# Patient Record
Sex: Female | Born: 1945 | Hispanic: No | Smoking: Never smoker
Health system: Southern US, Community
[De-identification: ages and names within clinical notes are randomized; demographics above are authoritative.]

## PROBLEM LIST (undated history)

## (undated) DIAGNOSIS — R42 Dizziness and giddiness: Secondary | ICD-10-CM

## (undated) DIAGNOSIS — I10 Essential (primary) hypertension: Secondary | ICD-10-CM

## (undated) DIAGNOSIS — Z9289 Personal history of other medical treatment: Secondary | ICD-10-CM

## (undated) DIAGNOSIS — H269 Unspecified cataract: Secondary | ICD-10-CM

## (undated) DIAGNOSIS — R091 Pleurisy: Secondary | ICD-10-CM

## (undated) DIAGNOSIS — J189 Pneumonia, unspecified organism: Secondary | ICD-10-CM

## (undated) HISTORY — PX: AUGMENTATION MAMMAPLASTY: SUR837

## (undated) HISTORY — PX: BREAST SURGERY: SHX581

---

## 1978-01-24 HISTORY — PX: LACERATION REPAIR: SHX5168

## 2003-01-25 HISTORY — PX: BREAST EXCISIONAL BIOPSY: SUR124

## 2011-03-25 ENCOUNTER — Emergency Department: Payer: Self-pay | Admitting: Emergency Medicine

## 2011-03-25 LAB — CBC
HGB: 13.2 g/dL (ref 12.0–16.0)
MCHC: 33.7 g/dL (ref 32.0–36.0)
MCV: 91 fL (ref 80–100)
RDW: 13.2 % (ref 11.5–14.5)
WBC: 9.1 10*3/uL (ref 3.6–11.0)

## 2011-03-25 LAB — COMPREHENSIVE METABOLIC PANEL
Alkaline Phosphatase: 76 U/L (ref 50–136)
Bilirubin,Total: 0.4 mg/dL (ref 0.2–1.0)
Chloride: 102 mmol/L (ref 98–107)
Co2: 30 mmol/L (ref 21–32)
Creatinine: 0.98 mg/dL (ref 0.60–1.30)
EGFR (African American): >60
EGFR (Non-African Amer.): >60
Glucose: 97 mg/dL (ref 65–99)
SGPT (ALT): 34 U/L

## 2011-03-25 LAB — TROPONIN I: Troponin-I: <0.02 ng/mL

## 2011-09-05 ENCOUNTER — Ambulatory Visit: Payer: Self-pay | Admitting: Internal Medicine

## 2012-10-09 ENCOUNTER — Ambulatory Visit: Payer: Self-pay | Admitting: Internal Medicine

## 2013-06-03 ENCOUNTER — Emergency Department: Payer: Self-pay | Admitting: Emergency Medicine

## 2013-11-04 ENCOUNTER — Ambulatory Visit: Payer: Self-pay | Admitting: Internal Medicine

## 2014-02-07 DIAGNOSIS — M159 Polyosteoarthritis, unspecified: Secondary | ICD-10-CM | POA: Diagnosis not present

## 2014-02-07 DIAGNOSIS — J3089 Other allergic rhinitis: Secondary | ICD-10-CM | POA: Diagnosis not present

## 2014-02-07 DIAGNOSIS — I10 Essential (primary) hypertension: Secondary | ICD-10-CM | POA: Diagnosis not present

## 2014-02-07 DIAGNOSIS — G44209 Tension-type headache, unspecified, not intractable: Secondary | ICD-10-CM | POA: Diagnosis not present

## 2014-08-04 DIAGNOSIS — G44209 Tension-type headache, unspecified, not intractable: Secondary | ICD-10-CM | POA: Diagnosis not present

## 2014-08-04 DIAGNOSIS — M159 Polyosteoarthritis, unspecified: Secondary | ICD-10-CM | POA: Diagnosis not present

## 2014-08-04 DIAGNOSIS — J3089 Other allergic rhinitis: Secondary | ICD-10-CM | POA: Diagnosis not present

## 2014-08-04 DIAGNOSIS — I10 Essential (primary) hypertension: Secondary | ICD-10-CM | POA: Diagnosis not present

## 2014-08-11 DIAGNOSIS — Z780 Asymptomatic menopausal state: Secondary | ICD-10-CM | POA: Diagnosis not present

## 2014-08-11 DIAGNOSIS — I10 Essential (primary) hypertension: Secondary | ICD-10-CM | POA: Diagnosis not present

## 2014-08-11 DIAGNOSIS — J0111 Acute recurrent frontal sinusitis: Secondary | ICD-10-CM | POA: Diagnosis not present

## 2014-08-11 DIAGNOSIS — Z0000 Encounter for general adult medical examination without abnormal findings: Secondary | ICD-10-CM | POA: Diagnosis not present

## 2014-09-09 DIAGNOSIS — H2513 Age-related nuclear cataract, bilateral: Secondary | ICD-10-CM | POA: Diagnosis not present

## 2014-09-22 DIAGNOSIS — J0111 Acute recurrent frontal sinusitis: Secondary | ICD-10-CM | POA: Diagnosis not present

## 2014-09-22 DIAGNOSIS — I10 Essential (primary) hypertension: Secondary | ICD-10-CM | POA: Diagnosis not present

## 2014-09-22 DIAGNOSIS — I8390 Asymptomatic varicose veins of unspecified lower extremity: Secondary | ICD-10-CM | POA: Diagnosis not present

## 2014-09-22 DIAGNOSIS — Z0000 Encounter for general adult medical examination without abnormal findings: Secondary | ICD-10-CM | POA: Diagnosis not present

## 2014-09-22 DIAGNOSIS — Z780 Asymptomatic menopausal state: Secondary | ICD-10-CM | POA: Diagnosis not present

## 2015-02-10 DIAGNOSIS — J0111 Acute recurrent frontal sinusitis: Secondary | ICD-10-CM | POA: Diagnosis not present

## 2015-02-10 DIAGNOSIS — Z0000 Encounter for general adult medical examination without abnormal findings: Secondary | ICD-10-CM | POA: Diagnosis not present

## 2015-02-10 DIAGNOSIS — I10 Essential (primary) hypertension: Secondary | ICD-10-CM | POA: Diagnosis not present

## 2015-02-10 DIAGNOSIS — Z780 Asymptomatic menopausal state: Secondary | ICD-10-CM | POA: Diagnosis not present

## 2015-02-10 DIAGNOSIS — I8390 Asymptomatic varicose veins of unspecified lower extremity: Secondary | ICD-10-CM | POA: Diagnosis not present

## 2015-02-13 DIAGNOSIS — I8390 Asymptomatic varicose veins of unspecified lower extremity: Secondary | ICD-10-CM | POA: Diagnosis not present

## 2015-02-13 DIAGNOSIS — I10 Essential (primary) hypertension: Secondary | ICD-10-CM | POA: Diagnosis not present

## 2015-02-13 DIAGNOSIS — Z139 Encounter for screening, unspecified: Secondary | ICD-10-CM | POA: Diagnosis not present

## 2015-03-16 DIAGNOSIS — M79609 Pain in unspecified limb: Secondary | ICD-10-CM | POA: Diagnosis not present

## 2015-03-16 DIAGNOSIS — I8311 Varicose veins of right lower extremity with inflammation: Secondary | ICD-10-CM | POA: Diagnosis not present

## 2015-03-16 DIAGNOSIS — I872 Venous insufficiency (chronic) (peripheral): Secondary | ICD-10-CM | POA: Diagnosis not present

## 2015-03-16 DIAGNOSIS — I8312 Varicose veins of left lower extremity with inflammation: Secondary | ICD-10-CM | POA: Diagnosis not present

## 2015-03-16 DIAGNOSIS — M1990 Unspecified osteoarthritis, unspecified site: Secondary | ICD-10-CM | POA: Diagnosis not present

## 2015-03-16 DIAGNOSIS — M7989 Other specified soft tissue disorders: Secondary | ICD-10-CM | POA: Diagnosis not present

## 2015-05-20 DIAGNOSIS — I8311 Varicose veins of right lower extremity with inflammation: Secondary | ICD-10-CM | POA: Diagnosis not present

## 2015-05-20 DIAGNOSIS — I872 Venous insufficiency (chronic) (peripheral): Secondary | ICD-10-CM | POA: Diagnosis not present

## 2015-05-20 DIAGNOSIS — M7989 Other specified soft tissue disorders: Secondary | ICD-10-CM | POA: Diagnosis not present

## 2015-05-20 DIAGNOSIS — M79609 Pain in unspecified limb: Secondary | ICD-10-CM | POA: Diagnosis not present

## 2015-05-20 DIAGNOSIS — I8312 Varicose veins of left lower extremity with inflammation: Secondary | ICD-10-CM | POA: Diagnosis not present

## 2015-05-20 DIAGNOSIS — M1990 Unspecified osteoarthritis, unspecified site: Secondary | ICD-10-CM | POA: Diagnosis not present

## 2015-07-07 DIAGNOSIS — I890 Lymphedema, not elsewhere classified: Secondary | ICD-10-CM | POA: Diagnosis not present

## 2015-07-16 DIAGNOSIS — M79609 Pain in unspecified limb: Secondary | ICD-10-CM | POA: Diagnosis not present

## 2015-07-16 DIAGNOSIS — M7989 Other specified soft tissue disorders: Secondary | ICD-10-CM | POA: Diagnosis not present

## 2015-07-16 DIAGNOSIS — I8311 Varicose veins of right lower extremity with inflammation: Secondary | ICD-10-CM | POA: Diagnosis not present

## 2015-07-16 DIAGNOSIS — M1990 Unspecified osteoarthritis, unspecified site: Secondary | ICD-10-CM | POA: Diagnosis not present

## 2015-07-16 DIAGNOSIS — I890 Lymphedema, not elsewhere classified: Secondary | ICD-10-CM | POA: Diagnosis not present

## 2015-07-16 DIAGNOSIS — I872 Venous insufficiency (chronic) (peripheral): Secondary | ICD-10-CM | POA: Diagnosis not present

## 2015-07-16 DIAGNOSIS — I8312 Varicose veins of left lower extremity with inflammation: Secondary | ICD-10-CM | POA: Diagnosis not present

## 2015-08-06 DIAGNOSIS — I890 Lymphedema, not elsewhere classified: Secondary | ICD-10-CM | POA: Diagnosis not present

## 2015-08-31 DIAGNOSIS — I10 Essential (primary) hypertension: Secondary | ICD-10-CM | POA: Diagnosis not present

## 2015-08-31 DIAGNOSIS — I8390 Asymptomatic varicose veins of unspecified lower extremity: Secondary | ICD-10-CM | POA: Diagnosis not present

## 2015-08-31 DIAGNOSIS — Z139 Encounter for screening, unspecified: Secondary | ICD-10-CM | POA: Diagnosis not present

## 2015-09-04 DIAGNOSIS — I10 Essential (primary) hypertension: Secondary | ICD-10-CM | POA: Diagnosis not present

## 2015-09-04 DIAGNOSIS — Z1239 Encounter for other screening for malignant neoplasm of breast: Secondary | ICD-10-CM | POA: Diagnosis not present

## 2015-09-04 DIAGNOSIS — G8929 Other chronic pain: Secondary | ICD-10-CM | POA: Diagnosis not present

## 2015-09-04 DIAGNOSIS — R252 Cramp and spasm: Secondary | ICD-10-CM | POA: Diagnosis not present

## 2015-09-04 DIAGNOSIS — M79605 Pain in left leg: Secondary | ICD-10-CM | POA: Diagnosis not present

## 2015-09-04 DIAGNOSIS — R790 Abnormal level of blood mineral: Secondary | ICD-10-CM | POA: Diagnosis not present

## 2015-09-06 DIAGNOSIS — I890 Lymphedema, not elsewhere classified: Secondary | ICD-10-CM | POA: Diagnosis not present

## 2015-10-07 DIAGNOSIS — I890 Lymphedema, not elsewhere classified: Secondary | ICD-10-CM | POA: Diagnosis not present

## 2015-10-19 DIAGNOSIS — R790 Abnormal level of blood mineral: Secondary | ICD-10-CM | POA: Diagnosis not present

## 2015-11-06 DIAGNOSIS — I890 Lymphedema, not elsewhere classified: Secondary | ICD-10-CM | POA: Diagnosis not present

## 2015-12-29 DIAGNOSIS — I10 Essential (primary) hypertension: Secondary | ICD-10-CM | POA: Diagnosis not present

## 2015-12-29 DIAGNOSIS — Z0000 Encounter for general adult medical examination without abnormal findings: Secondary | ICD-10-CM | POA: Diagnosis not present

## 2015-12-29 DIAGNOSIS — R002 Palpitations: Secondary | ICD-10-CM | POA: Diagnosis not present

## 2015-12-29 DIAGNOSIS — Z1231 Encounter for screening mammogram for malignant neoplasm of breast: Secondary | ICD-10-CM | POA: Diagnosis not present

## 2016-01-06 DIAGNOSIS — I890 Lymphedema, not elsewhere classified: Secondary | ICD-10-CM | POA: Diagnosis not present

## 2016-01-14 DIAGNOSIS — R002 Palpitations: Secondary | ICD-10-CM | POA: Diagnosis not present

## 2016-02-06 DIAGNOSIS — I890 Lymphedema, not elsewhere classified: Secondary | ICD-10-CM | POA: Diagnosis not present

## 2016-03-08 DIAGNOSIS — I890 Lymphedema, not elsewhere classified: Secondary | ICD-10-CM | POA: Diagnosis not present

## 2016-04-05 DIAGNOSIS — I890 Lymphedema, not elsewhere classified: Secondary | ICD-10-CM | POA: Diagnosis not present

## 2016-05-06 DIAGNOSIS — I890 Lymphedema, not elsewhere classified: Secondary | ICD-10-CM | POA: Diagnosis not present

## 2016-06-05 DIAGNOSIS — I890 Lymphedema, not elsewhere classified: Secondary | ICD-10-CM | POA: Diagnosis not present

## 2016-07-18 DIAGNOSIS — H43812 Vitreous degeneration, left eye: Secondary | ICD-10-CM | POA: Diagnosis not present

## 2016-07-18 DIAGNOSIS — H2513 Age-related nuclear cataract, bilateral: Secondary | ICD-10-CM | POA: Diagnosis not present

## 2016-07-20 DIAGNOSIS — H2513 Age-related nuclear cataract, bilateral: Secondary | ICD-10-CM | POA: Diagnosis not present

## 2016-07-22 ENCOUNTER — Other Ambulatory Visit: Payer: Self-pay | Admitting: Internal Medicine

## 2016-07-22 DIAGNOSIS — Z1231 Encounter for screening mammogram for malignant neoplasm of breast: Secondary | ICD-10-CM

## 2016-07-24 DIAGNOSIS — H269 Unspecified cataract: Secondary | ICD-10-CM

## 2016-07-24 HISTORY — DX: Unspecified cataract: H26.9

## 2016-08-01 NOTE — H&P (Signed)
See scanned note.

## 2016-08-03 ENCOUNTER — Encounter
Admission: RE | Disposition: A | Discharge status: Home / Self Care | Payer: Self-pay | Source: Ambulatory Visit | Attending: Ophthalmology

## 2016-08-03 ENCOUNTER — Ambulatory Visit
Admission: RE | Admit: 2016-08-03 | Discharge: 2016-08-03 | Disposition: A | Discharge status: Home / Self Care | Payer: Medicare HMO | Source: Ambulatory Visit | Attending: Ophthalmology | Admitting: Ophthalmology

## 2016-08-03 ENCOUNTER — Encounter: Payer: Self-pay | Admitting: *Deleted

## 2016-08-03 ENCOUNTER — Ambulatory Visit: Payer: Medicare HMO | Admitting: Certified Registered"

## 2016-08-03 DIAGNOSIS — H2511 Age-related nuclear cataract, right eye: Secondary | ICD-10-CM | POA: Insufficient documentation

## 2016-08-03 DIAGNOSIS — Z9889 Other specified postprocedural states: Secondary | ICD-10-CM | POA: Insufficient documentation

## 2016-08-03 DIAGNOSIS — I10 Essential (primary) hypertension: Secondary | ICD-10-CM | POA: Insufficient documentation

## 2016-08-03 DIAGNOSIS — Z79899 Other long term (current) drug therapy: Secondary | ICD-10-CM | POA: Insufficient documentation

## 2016-08-03 DIAGNOSIS — Z8701 Personal history of pneumonia (recurrent): Secondary | ICD-10-CM | POA: Diagnosis not present

## 2016-08-03 DIAGNOSIS — H2513 Age-related nuclear cataract, bilateral: Secondary | ICD-10-CM | POA: Diagnosis not present

## 2016-08-03 HISTORY — DX: Essential (primary) hypertension: I10

## 2016-08-03 HISTORY — DX: Pleurisy: R09.1

## 2016-08-03 HISTORY — DX: Unspecified cataract: H26.9

## 2016-08-03 HISTORY — DX: Pneumonia, unspecified organism: J18.9

## 2016-08-03 HISTORY — PX: CATARACT EXTRACTION W/PHACO: SHX586

## 2016-08-03 HISTORY — DX: Personal history of other medical treatment: Z92.89

## 2016-08-03 SURGERY — PHACOEMULSIFICATION, CATARACT, WITH IOL INSERTION
Anesthesia: Monitor Anesthesia Care | Site: Eye | Laterality: Right | Wound class: Clean

## 2016-08-03 MED ORDER — MOXIFLOXACIN HCL 0.5 % OP SOLN
1.0000 [drp] | OPHTHALMIC | Status: DC | PRN
  Administered 2016-08-03 (×3): 1 [drp] via OPHTHALMIC

## 2016-08-03 MED ORDER — MIDAZOLAM HCL 2 MG/2ML IJ SOLN
INTRAMUSCULAR | Status: DC | PRN
  Administered 2016-08-03: 1 mg via INTRAVENOUS

## 2016-08-03 MED ORDER — ALFENTANIL 500 MCG/ML IJ INJ
INJECTION | INTRAVENOUS | Status: DC | PRN
  Administered 2016-08-03: 250 ug via INTRAVENOUS
  Administered 2016-08-03: 500 ug via INTRAVENOUS

## 2016-08-03 MED ORDER — BUPIVACAINE HCL (PF) 0.75 % IJ SOLN
INTRAMUSCULAR | Status: AC
  Filled 2016-08-03: qty 10

## 2016-08-03 MED ORDER — BUPIVACAINE HCL (PF) 0.75 % IJ SOLN
INTRAMUSCULAR | Status: DC | PRN
  Administered 2016-08-03: 09:00:00 via OPHTHALMIC

## 2016-08-03 MED ORDER — TRYPAN BLUE 0.06 % OP SOLN
OPHTHALMIC | Status: AC
Start: 2016-08-03 — End: 2016-08-03
  Filled 2016-08-03: qty 0.5

## 2016-08-03 MED ORDER — CEFUROXIME OPHTHALMIC INJECTION 1 MG/0.1 ML
INJECTION | OPHTHALMIC | Status: AC
  Filled 2016-08-03: qty 0.1

## 2016-08-03 MED ORDER — PHENYLEPHRINE HCL 10 % OP SOLN
1.0000 [drp] | OPHTHALMIC | Status: AC | PRN
  Administered 2016-08-03 (×4): 1 [drp] via OPHTHALMIC

## 2016-08-03 MED ORDER — CARBACHOL 0.01 % IO SOLN
INTRAOCULAR | Status: DC | PRN
  Administered 2016-08-03: 0.5 mL via INTRAOCULAR

## 2016-08-03 MED ORDER — MOXIFLOXACIN HCL 0.5 % OP SOLN
OPHTHALMIC | Status: DC | PRN
  Administered 2016-08-03: 0.2 mL via OPHTHALMIC

## 2016-08-03 MED ORDER — POVIDONE-IODINE 5 % OP SOLN
OPHTHALMIC | Status: DC | PRN
  Administered 2016-08-03: 1 via OPHTHALMIC

## 2016-08-03 MED ORDER — LIDOCAINE HCL (PF) 4 % IJ SOLN
INTRAOCULAR | Status: DC | PRN
  Administered 2016-08-03: 4 mL via OPHTHALMIC

## 2016-08-03 MED ORDER — GLYCOPYRROLATE 0.2 MG/ML IJ SOLN
INTRAMUSCULAR | Status: AC
  Filled 2016-08-03: qty 1

## 2016-08-03 MED ORDER — HYALURONIDASE HUMAN 150 UNIT/ML IJ SOLN
INTRAMUSCULAR | Status: AC
  Filled 2016-08-03: qty 1

## 2016-08-03 MED ORDER — EPINEPHRINE PF 1 MG/ML IJ SOLN
INTRAMUSCULAR | Status: AC
  Filled 2016-08-03: qty 1

## 2016-08-03 MED ORDER — PHENYLEPHRINE HCL 10 MG/ML IJ SOLN
INTRAMUSCULAR | Status: AC
  Filled 2016-08-03: qty 1

## 2016-08-03 MED ORDER — SODIUM CHLORIDE 0.9 % IV SOLN
INTRAVENOUS | Status: DC
  Administered 2016-08-03: 25 mL/h via INTRAVENOUS

## 2016-08-03 MED ORDER — CYCLOPENTOLATE HCL 2 % OP SOLN
OPHTHALMIC | Status: AC
Start: 2016-08-03 — End: 2016-08-03
  Administered 2016-08-03: 1 [drp] via OPHTHALMIC
  Filled 2016-08-03: qty 2

## 2016-08-03 MED ORDER — NA CHONDROIT SULF-NA HYALURON 40-17 MG/ML IO SOLN
INTRAOCULAR | Status: AC
  Filled 2016-08-03: qty 1

## 2016-08-03 MED ORDER — EPINEPHRINE PF 1 MG/ML IJ SOLN
INTRAMUSCULAR | Status: DC | PRN
  Administered 2016-08-03: 09:00:00 via OPHTHALMIC

## 2016-08-03 MED ORDER — MOXIFLOXACIN HCL 0.5 % OP SOLN
OPHTHALMIC | Status: AC
  Administered 2016-08-03: 1 [drp] via OPHTHALMIC
  Filled 2016-08-03: qty 3

## 2016-08-03 MED ORDER — TETRACAINE HCL 0.5 % OP SOLN
OPHTHALMIC | Status: AC
  Filled 2016-08-03: qty 4

## 2016-08-03 MED ORDER — POVIDONE-IODINE 5 % OP SOLN
OPHTHALMIC | Status: AC
  Filled 2016-08-03: qty 30

## 2016-08-03 MED ORDER — LIDOCAINE HCL (PF) 4 % IJ SOLN
INTRAMUSCULAR | Status: AC
  Filled 2016-08-03: qty 5

## 2016-08-03 MED ORDER — CEFUROXIME OPHTHALMIC INJECTION 1 MG/0.1 ML
INJECTION | OPHTHALMIC | Status: DC | PRN
  Administered 2016-08-03: 0.1 mL via INTRACAMERAL

## 2016-08-03 MED ORDER — TETRACAINE HCL 0.5 % OP SOLN
OPHTHALMIC | Status: DC | PRN
  Administered 2016-08-03: 1 [drp] via OPHTHALMIC

## 2016-08-03 MED ORDER — MIDAZOLAM HCL 2 MG/2ML IJ SOLN
INTRAMUSCULAR | Status: AC
  Filled 2016-08-03: qty 2

## 2016-08-03 MED ORDER — NA CHONDROIT SULF-NA HYALURON 40-17 MG/ML IO SOLN
INTRAOCULAR | Status: DC | PRN
  Administered 2016-08-03: 1 mL via INTRAOCULAR

## 2016-08-03 MED ORDER — TRYPAN BLUE 0.06 % OP SOLN
OPHTHALMIC | Status: DC | PRN
  Administered 2016-08-03: 0.5 mL via INTRAOCULAR

## 2016-08-03 MED ORDER — PHENYLEPHRINE HCL 10 % OP SOLN
OPHTHALMIC | Status: AC
  Administered 2016-08-03: 1 [drp] via OPHTHALMIC
  Filled 2016-08-03: qty 5

## 2016-08-03 MED ORDER — CYCLOPENTOLATE HCL 2 % OP SOLN
1.0000 [drp] | OPHTHALMIC | Status: AC | PRN
  Administered 2016-08-03 (×4): 1 [drp] via OPHTHALMIC

## 2016-08-03 SURGICAL SUPPLY — 28 items
CANNULA ANT/CHMB 27GA (MISCELLANEOUS) ×2 IMPLANT
CORD BIP STRL DISP 12FT (MISCELLANEOUS) ×2 IMPLANT
DRAPE XRAY CASSETTE 23X24 (DRAPES) ×2 IMPLANT
ERASER HMR WETFIELD 18G (MISCELLANEOUS) ×2 IMPLANT
FILTER MILLEX .045 (MISCELLANEOUS) ×1 IMPLANT
FLTR MILLEX .045 (MISCELLANEOUS) ×2
GLOVE BIO SURGEON STRL SZ8 (GLOVE) ×2 IMPLANT
GLOVE SURG LX 6.5 MICRO (GLOVE) ×1
GLOVE SURG LX 8.0 MICRO (GLOVE) ×1
GLOVE SURG LX STRL 6.5 MICRO (GLOVE) ×1 IMPLANT
GLOVE SURG LX STRL 8.0 MICRO (GLOVE) ×1 IMPLANT
GOWN STRL REUS W/ TWL LRG LVL3 (GOWN DISPOSABLE) ×1 IMPLANT
GOWN STRL REUS W/ TWL XL LVL3 (GOWN DISPOSABLE) ×1 IMPLANT
GOWN STRL REUS W/TWL LRG LVL3 (GOWN DISPOSABLE) ×1
GOWN STRL REUS W/TWL XL LVL3 (GOWN DISPOSABLE) ×1
LABEL CATARACT MEDS ST (LABEL) ×2 IMPLANT
LENS IOL ACRYSOF IQ 21.0 (Intraocular Lens) ×2 IMPLANT
PACK CATARACT (MISCELLANEOUS) ×2 IMPLANT
PACK CATARACT DINGLEDEIN LX (MISCELLANEOUS) ×2 IMPLANT
PACK EYE AFTER SURG (MISCELLANEOUS) ×2 IMPLANT
SHLD EYE VISITEC  UNIV (MISCELLANEOUS) ×2 IMPLANT
SOL BSS BAG (MISCELLANEOUS) ×2
SOLUTION BSS BAG (MISCELLANEOUS) ×1 IMPLANT
SUT SILK 5-0 (SUTURE) ×2 IMPLANT
SYR 3ML LL SCALE MARK (SYRINGE) ×2 IMPLANT
SYR 5ML LL (SYRINGE) ×2 IMPLANT
WATER STERILE IRR 250ML POUR (IV SOLUTION) ×2 IMPLANT
WIPE NON LINTING 3.25X3.25 (MISCELLANEOUS) ×2 IMPLANT

## 2016-08-03 NOTE — Anesthesia Procedure Notes (Signed)
Performed by: Shanta Hartner Pre-anesthesia Checklist: Patient identified, Emergency Drugs available, Suction available, Patient being monitored and Timeout performed Oxygen Delivery Method: Nasal cannula       

## 2016-08-03 NOTE — Anesthesia Preprocedure Evaluation (Signed)
Anesthesia Evaluation  Patient identified by MRN, date of birth, ID band Patient awake    Reviewed: Allergy & Precautions, H&P , NPO status , Patient's Chart, lab work & pertinent test results, reviewed documented beta blocker date and time   History of Anesthesia Complications Negative for: history of anesthetic complications  Airway Mallampati: I  TM Distance: >3 FB Neck ROM: full    Dental  (+) Poor Dentition, Dental Advidsory Given   Pulmonary neg pulmonary ROS,           Cardiovascular Exercise Tolerance: Good hypertension, (-) angina(-) CAD, (-) Past MI, (-) Cardiac Stents and (-) CABG (-) dysrhythmias (-) Valvular Problems/Murmurs     Neuro/Psych negative neurological ROS  negative psych ROS   GI/Hepatic negative GI ROS, Neg liver ROS,   Endo/Other  negative endocrine ROS  Renal/GU negative Renal ROS  negative genitourinary   Musculoskeletal   Abdominal   Peds  Hematology negative hematology ROS (+)   Anesthesia Other Findings Past Medical History: 07/2016: Cataract     Comment: right No date: Hypertension No date: Pleurisy No date: Pneumonia     Comment: history No date: Transfusion history   Reproductive/Obstetrics negative OB ROS                             Anesthesia Physical Anesthesia Plan  ASA: II  Anesthesia Plan: MAC   Post-op Pain Management:    Induction:   PONV Risk Score and Plan: 2 and Treatment may vary due to age or medical condition  Airway Management Planned: Nasal Cannula  Additional Equipment:   Intra-op Plan:   Post-operative Plan:   Informed Consent: I have reviewed the patients History and Physical, chart, labs and discussed the procedure including the risks, benefits and alternatives for the proposed anesthesia with the patient or authorized representative who has indicated his/her understanding and acceptance.   Dental Advisory  Given  Plan Discussed with: Anesthesiologist, CRNA and Surgeon  Anesthesia Plan Comments:         Anesthesia Quick Evaluation

## 2016-08-03 NOTE — Op Note (Signed)
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 Item Serial No. Manufacturer Lot No. LRB No. Used  LENS IOL ACRYSOF IQ 21.0 - [id number] Intraocular Lens LENS IOL ACRYSOF IQ 21.0 [id number] ALCON   Right 1   Anesthesia: 2% Lidocaine and 4% Marcaine in a 50/50 mixture with 10 unites/ml of Hylenex given as a peribulbar Anesthesiologist: Anesthesiologist: Lenard SimmerKarenz, Andrew, MD CRNA: Casey BurkittHoang, Thuy, CRNA Complications: none Estimated Blood Loss: less than 1 ml  Description of procedure:  The patient was given anesthesia and sedation via intravenous access. The patient was then prepped and draped in the usual fashion. A 25-gauge needle was bent for initiating the capsulorhexis. A 5-0 silk suture was placed through the conjunctiva superior and inferiorly to serve as bridle sutures. Hemostasis was obtained at the superior limbus using an eraser cautery. A partial thickness groove was made at the anterior surgical limbus with a 64 Beaver blade and this was dissected anteriorly with an SYSCOlcon Crescent knife. The anterior chamber was entered at 10 o'clock and 2 o'clock with a 1.0 mm paracentesis knife. Epi-Shugarcaine 0.5 CC [9 cc BSS Plus (Alcon), 3 cc 4% preservative-free lidocaine (Hospira) and 4 cc 1:1000 preservative-free, bisulfite-free epinephrine] was injected into the anterior chamber via the paracentesis tract. Air was introduced via the paracentesis to replace the aqueous and Vision Blue was used to paint the surface of the anterior capsule. The air was then replaced with DiscoVisc.   The anterior chamber was entered through the lamellar dissection with a 2.6 mm Alcon keratome.A continuous tear curvilinear  capsulorhexis was performed using a bent 25-gauge needle.  Balance salt on a syringe was used to perform hydro-dissection and phacoemulsification was carried out using a divide and conquer technique. Procedure(s) with comments: CATARACT EXTRACTION PHACO AND INTRAOCULAR LENS PLACEMENT (IOC) (Right) - [place] 01:16.9 AP% 21.6 CDE 27.70 Fluid Pack Lot # [id number]. Irrigation/aspiration was used to remove the residual cortex and the capsular bag was inflated with DiscoVisc. Irrigation/aspiration was used to remove the residual cortex and the capsular bag was inflated with DiscoVisc. The intraocular lens was inserted into the capsular bag using a pre-loaded UltraSert Delivery System. Irrigation/aspiration was used to remove the residual DiscoVisc. The wound was inflated with balanced salt and checked for leaks. None were found. Miostat was injected via the paracentesis track and 0.1 ml of cefuroxime containing 1 mg of drug  was injected via the paracentesis track. The wound was checked for leaks again and none were found.   The bridal sutures were removed and two drops of Vigamox were placed on the eye. An eye shield was placed to protect the eye and the patient was discharged to the recovery area in good condition.   Chue Berkovich MD

## 2016-08-03 NOTE — Discharge Instructions (Signed)
Eye Surgery Discharge Instructions  Expect mild scratchy sensation or mild soreness. DO NOT RUB YOUR EYE!  The day of surgery:  Minimal physical activity, but bed rest is not required  No reading, computer work, or close hand work  No bending, lifting, or straining.  May watch TV  For 24 hours:  No driving, legal decisions, or alcoholic beverages  Safety precautions  Eat anything you prefer: It is better to start with liquids, then soup then solid foods.  _____ Eye patch should be worn until postoperative exam tomorrow.  ____ Solar shield eyeglasses should be worn for comfort in the sunlight/patch while sleeping  Resume all regular medications including aspirin or Coumadin if these were discontinued prior to surgery. You may shower, bathe, shave, or wash your hair. Tylenol may be taken for mild discomfort.  Call your doctor if you experience significant pain, nausea, or vomiting, fever > 101 or other signs of infection. [phone] or [phone] Specific instructions:  Follow-up Information    Hanad Leino, Viviann SpareSteven, MD Follow up.   Specialty:  Ophthalmology Why:  July 12 at 10:30am Contact information: [address]   [place] [address] [phone]

## 2016-08-03 NOTE — Anesthesia Post-op Follow-up Note (Cosign Needed)
Anesthesia QCDR form completed.        

## 2016-08-03 NOTE — Transfer of Care (Signed)
Immediate Anesthesia Transfer of Care Note  Patient: JAMISEN HAWES  Procedure(s) Performed: Procedure(s) with comments: CATARACT EXTRACTION PHACO AND INTRAOCULAR LENS PLACEMENT (IOC) (Right) - [place] 01:16.9 AP% 21.6 CDE 27.70 Fluid Pack Lot # [id number]  Patient Location: PACU  Anesthesia Type:MAC  Level of Consciousness: awake, alert  and oriented  Airway & Oxygen Therapy: Patient Spontanous Breathing  Post-op Assessment: Report given to RN and Post -op Vital signs reviewed and stable  Post vital signs: Reviewed and stable  Last Vitals:  Vitals:   08/03/16 0706 08/03/16 0920  BP: (!) 141/79 139/73  Pulse: 61 65  Resp: 15 14  Temp: 36.8 C     Last Pain:  Vitals:   08/03/16 0706  TempSrc: Oral      Patients Stated Pain Goal: 0 (75/91/63 8466)  Complications: No apparent anesthesia complications

## 2016-08-03 NOTE — Interval H&P Note (Signed)
History and Physical Interval Note:  08/03/2016 7:27 AM  Leslie Vincent  has presented today for surgery, with the diagnosis of NUCLEAR SCLEROTIC CATARACT RIGHT EYE  The various methods of treatment have been discussed with the patient and family. After consideration of risks, benefits and other options for treatment, the patient has consented to  Procedure(s) with comments: CATARACT EXTRACTION PHACO AND INTRAOCULAR LENS PLACEMENT (IOC) (Right) - US AP% CDE Fluid Pack Lot # [id number] as a surgical intervention .  The patient's history has been reviewed, patient examined, no change in status, stable for surgery.  I have reviewed the patient's chart and labs.  Questions were answered to the patient's satisfaction.     Leslie Vincent

## 2016-08-03 NOTE — Anesthesia Postprocedure Evaluation (Signed)
Anesthesia Post Note  Patient: Leslie Vincent  Procedure(s) Performed: Procedure(s) (LRB): CATARACT EXTRACTION PHACO AND INTRAOCULAR LENS PLACEMENT (IOC) (Right)  Patient location during evaluation: PACU Anesthesia Type: MAC Level of consciousness: awake, awake and alert and oriented Pain management: pain level controlled Vital Signs Assessment: post-procedure vital signs reviewed and stable Respiratory status: spontaneous breathing, nonlabored ventilation and respiratory function stable Cardiovascular status: stable     Last Vitals:  Vitals:   08/03/16 0920 08/03/16 0922  BP: 139/73 139/73  Pulse: 65 68  Resp: 14 16  Temp:  (!) 35.9 C    Last Pain:  Vitals:   08/03/16 0706  TempSrc: Oral                 [employer]

## 2016-08-15 DIAGNOSIS — I10 Essential (primary) hypertension: Secondary | ICD-10-CM | POA: Diagnosis not present

## 2016-08-15 DIAGNOSIS — Z0000 Encounter for general adult medical examination without abnormal findings: Secondary | ICD-10-CM | POA: Diagnosis not present

## 2016-08-15 DIAGNOSIS — L03114 Cellulitis of left upper limb: Secondary | ICD-10-CM | POA: Diagnosis not present

## 2016-08-29 ENCOUNTER — Ambulatory Visit
Admission: RE | Admit: 2016-08-29 | Discharge: 2016-08-29 | Disposition: A | Discharge status: Home / Self Care | Payer: Medicare HMO | Source: Ambulatory Visit | Attending: Internal Medicine | Admitting: Internal Medicine

## 2016-08-29 DIAGNOSIS — Z1231 Encounter for screening mammogram for malignant neoplasm of breast: Secondary | ICD-10-CM | POA: Diagnosis not present

## 2016-09-09 DIAGNOSIS — Z961 Presence of intraocular lens: Secondary | ICD-10-CM | POA: Diagnosis not present

## 2017-03-09 DIAGNOSIS — L03114 Cellulitis of left upper limb: Secondary | ICD-10-CM | POA: Diagnosis not present

## 2017-03-09 DIAGNOSIS — I10 Essential (primary) hypertension: Secondary | ICD-10-CM | POA: Diagnosis not present

## 2017-03-17 DIAGNOSIS — N8110 Cystocele, unspecified: Secondary | ICD-10-CM | POA: Diagnosis not present

## 2017-03-17 DIAGNOSIS — Z0000 Encounter for general adult medical examination without abnormal findings: Secondary | ICD-10-CM | POA: Diagnosis not present

## 2017-03-17 DIAGNOSIS — I10 Essential (primary) hypertension: Secondary | ICD-10-CM | POA: Diagnosis not present

## 2017-03-17 DIAGNOSIS — J302 Other seasonal allergic rhinitis: Secondary | ICD-10-CM | POA: Diagnosis not present

## 2017-04-25 DIAGNOSIS — H2512 Age-related nuclear cataract, left eye: Secondary | ICD-10-CM | POA: Diagnosis not present

## 2018-02-22 ENCOUNTER — Other Ambulatory Visit: Payer: Self-pay | Admitting: Internal Medicine

## 2018-02-22 DIAGNOSIS — Z1231 Encounter for screening mammogram for malignant neoplasm of breast: Secondary | ICD-10-CM

## 2019-03-22 ENCOUNTER — Other Ambulatory Visit: Payer: Self-pay | Admitting: Internal Medicine

## 2019-03-22 DIAGNOSIS — Z1231 Encounter for screening mammogram for malignant neoplasm of breast: Secondary | ICD-10-CM

## 2019-05-22 ENCOUNTER — Other Ambulatory Visit: Payer: Self-pay | Admitting: Internal Medicine

## 2019-05-22 DIAGNOSIS — Z1231 Encounter for screening mammogram for malignant neoplasm of breast: Secondary | ICD-10-CM

## 2019-08-06 ENCOUNTER — Ambulatory Visit
Admission: RE | Admit: 2019-08-06 | Discharge: 2019-08-06 | Disposition: A | Discharge status: Home / Self Care | Payer: Medicare Other | Source: Ambulatory Visit | Attending: Internal Medicine | Admitting: Internal Medicine

## 2019-08-06 DIAGNOSIS — Z1231 Encounter for screening mammogram for malignant neoplasm of breast: Secondary | ICD-10-CM | POA: Insufficient documentation

## 2019-08-08 ENCOUNTER — Other Ambulatory Visit: Payer: Self-pay | Admitting: Internal Medicine

## 2019-08-08 DIAGNOSIS — N6320 Unspecified lump in the left breast, unspecified quadrant: Secondary | ICD-10-CM

## 2019-08-08 DIAGNOSIS — N644 Mastodynia: Secondary | ICD-10-CM

## 2019-08-14 ENCOUNTER — Ambulatory Visit
Admission: RE | Admit: 2019-08-14 | Discharge: 2019-08-14 | Disposition: A | Discharge status: Home / Self Care | Payer: Medicare Other | Source: Ambulatory Visit | Attending: Internal Medicine | Admitting: Internal Medicine

## 2019-08-14 DIAGNOSIS — N644 Mastodynia: Secondary | ICD-10-CM | POA: Insufficient documentation

## 2019-08-14 DIAGNOSIS — N6320 Unspecified lump in the left breast, unspecified quadrant: Secondary | ICD-10-CM | POA: Insufficient documentation

## 2019-12-16 ENCOUNTER — Encounter: Payer: Self-pay | Admitting: Ophthalmology

## 2019-12-16 ENCOUNTER — Other Ambulatory Visit: Payer: Self-pay

## 2019-12-18 NOTE — Discharge Instructions (Signed)

## 2019-12-20 ENCOUNTER — Other Ambulatory Visit: Payer: Self-pay

## 2019-12-20 ENCOUNTER — Other Ambulatory Visit
Admission: RE | Admit: 2019-12-20 | Discharge: 2019-12-20 | Disposition: A | Discharge status: Home / Self Care | Payer: Medicare Other | Source: Ambulatory Visit | Attending: Ophthalmology | Admitting: Ophthalmology

## 2019-12-20 DIAGNOSIS — Z01812 Encounter for preprocedural laboratory examination: Secondary | ICD-10-CM | POA: Insufficient documentation

## 2019-12-20 DIAGNOSIS — Z20822 Contact with and (suspected) exposure to covid-19: Secondary | ICD-10-CM | POA: Insufficient documentation

## 2019-12-21 LAB — SARS CORONAVIRUS 2 (TAT 6-24 HRS): SARS Coronavirus 2: NEGATIVE

## 2019-12-23 MED ORDER — SODIUM CHLORIDE 0.9 % IV SOLN: INTRAVENOUS | Status: DC

## 2019-12-24 ENCOUNTER — Encounter: Payer: Self-pay | Admitting: Ophthalmology

## 2019-12-24 ENCOUNTER — Encounter: Payer: Self-pay | Admitting: Anesthesiology

## 2019-12-24 ENCOUNTER — Ambulatory Visit: Payer: Self-pay | Admitting: Anesthesiology

## 2019-12-24 ENCOUNTER — Ambulatory Visit: Admission: RE | Admit: 2019-12-24 | Payer: Medicare Other | Source: Home / Self Care | Admitting: Ophthalmology

## 2019-12-24 ENCOUNTER — Ambulatory Visit
Admission: RE | Admit: 2019-12-24 | Discharge: 2019-12-24 | Disposition: A | Discharge status: Home / Self Care | Payer: Medicare Other | Attending: Ophthalmology | Admitting: Ophthalmology

## 2019-12-24 ENCOUNTER — Other Ambulatory Visit: Payer: Self-pay

## 2019-12-24 ENCOUNTER — Encounter
Admission: RE | Disposition: A | Discharge status: Home / Self Care | Payer: Self-pay | Source: Home / Self Care | Attending: Ophthalmology

## 2019-12-24 DIAGNOSIS — Z79899 Other long term (current) drug therapy: Secondary | ICD-10-CM | POA: Insufficient documentation

## 2019-12-24 DIAGNOSIS — H2512 Age-related nuclear cataract, left eye: Secondary | ICD-10-CM | POA: Diagnosis not present

## 2019-12-24 HISTORY — DX: Dizziness and giddiness: R42

## 2019-12-24 HISTORY — PX: CATARACT EXTRACTION W/PHACO: SHX586

## 2019-12-24 SURGERY — PHACOEMULSIFICATION, CATARACT, WITH IOL INSERTION
Anesthesia: Monitor Anesthesia Care | Site: Eye | Laterality: Left

## 2019-12-24 MED ORDER — MIDAZOLAM HCL 2 MG/2ML IJ SOLN
INTRAMUSCULAR | Status: DC | PRN
  Administered 2019-12-24: 1 mg via INTRAVENOUS

## 2019-12-24 MED ORDER — FENTANYL CITRATE (PF) 100 MCG/2ML IJ SOLN
INTRAMUSCULAR | Status: DC | PRN
  Administered 2019-12-24: 25 ug via INTRAVENOUS

## 2019-12-24 MED ORDER — NA CHONDROIT SULF-NA HYALURON 40-17 MG/ML IO SOLN
INTRAOCULAR | Status: DC | PRN
  Administered 2019-12-24: 1 mL via INTRAOCULAR

## 2019-12-24 MED ORDER — ARMC OPHTHALMIC DILATING DROPS
1.0000 "application " | OPHTHALMIC | Status: DC | PRN
  Administered 2019-12-24 (×3): 1 via OPHTHALMIC

## 2019-12-24 MED ORDER — TETRACAINE HCL 0.5 % OP SOLN
1.0000 [drp] | OPHTHALMIC | Status: DC | PRN
  Administered 2019-12-24 (×3): 1 [drp] via OPHTHALMIC

## 2019-12-24 MED ORDER — BRIMONIDINE TARTRATE-TIMOLOL 0.2-0.5 % OP SOLN
OPHTHALMIC | Status: DC | PRN
  Administered 2019-12-24: 1 [drp] via OPHTHALMIC

## 2019-12-24 MED ORDER — MIDAZOLAM HCL 2 MG/2ML IJ SOLN
INTRAMUSCULAR | Status: AC
  Filled 2019-12-24: qty 2

## 2019-12-24 MED ORDER — EPINEPHRINE PF 1 MG/ML IJ SOLN
INTRAOCULAR | Status: DC | PRN
  Administered 2019-12-24: 103 mL via OPHTHALMIC

## 2019-12-24 MED ORDER — LIDOCAINE HCL (PF) 2 % IJ SOLN
INTRAOCULAR | Status: DC | PRN
  Administered 2019-12-24: 2 mL

## 2019-12-24 MED ORDER — FENTANYL CITRATE (PF) 100 MCG/2ML IJ SOLN
INTRAMUSCULAR | Status: AC
  Filled 2019-12-24: qty 2

## 2019-12-24 MED ORDER — MOXIFLOXACIN HCL 0.5 % OP SOLN
OPHTHALMIC | Status: DC | PRN
  Administered 2019-12-24: 0.2 mL via OPHTHALMIC

## 2019-12-24 SURGICAL SUPPLY — 17 items
CANNULA ANT/CHMB 27GA (MISCELLANEOUS) ×6 IMPLANT
GLOVE BIO SURGEON STRL SZ8 (GLOVE) ×3 IMPLANT
GLOVE BIOGEL M 6.5 STRL (GLOVE) ×3 IMPLANT
GLOVE SURG LX 8.0 MICRO (GLOVE) ×2
GLOVE SURG LX STRL 8.0 MICRO (GLOVE) ×1 IMPLANT
GOWN STRL REUS W/ TWL LRG LVL3 (GOWN DISPOSABLE) ×2 IMPLANT
GOWN STRL REUS W/TWL LRG LVL3 (GOWN DISPOSABLE) ×4
LENS IOL ACRYSOF IQ 21.5 (Intraocular Lens) ×3 IMPLANT
NEEDLE FILTER BLUNT 18X 1/2SAF (NEEDLE) ×2
NEEDLE FILTER BLUNT 18X1 1/2 (NEEDLE) ×1 IMPLANT
PACK CATARACT (MISCELLANEOUS) ×3 IMPLANT
PACK CATARACT BRASINGTON LX (MISCELLANEOUS) ×3 IMPLANT
PACK EYE AFTER SURG (MISCELLANEOUS) ×3 IMPLANT
SYR 3ML LL SCALE MARK (SYRINGE) ×6 IMPLANT
SYR TB 1ML LUER SLIP (SYRINGE) ×3 IMPLANT
WATER STERILE IRR 250ML POUR (IV SOLUTION) ×3 IMPLANT
WIPE NON LINTING 3.25X3.25 (MISCELLANEOUS) ×3 IMPLANT

## 2019-12-24 NOTE — Anesthesia Preprocedure Evaluation (Signed)
Anesthesia Evaluation  Patient identified by MRN, date of birth, ID band Patient awake    Reviewed: Allergy & Precautions, NPO status , Patient's Chart, lab work & pertinent test results  Airway Mallampati: II       Dental no notable dental hx.    Pulmonary neg pulmonary ROS,    Pulmonary exam normal breath sounds clear to auscultation       Cardiovascular hypertension, negative cardio ROS Normal cardiovascular exam Rhythm:Regular Rate:Normal     Neuro/Psych negative neurological ROS  negative psych ROS   GI/Hepatic negative GI ROS, Neg liver ROS,   Endo/Other  negative endocrine ROS  Renal/GU negative Renal ROS  negative genitourinary   Musculoskeletal negative musculoskeletal ROS (+)   Abdominal   Peds negative pediatric ROS (+)  Hematology negative hematology ROS (+)   Anesthesia Other Findings .Marland KitchenPast Medical History: 07/2016: Cataract     Comment:  right No date: Hypertension No date: Pleurisy No date: Pneumonia     Comment:  history No date: Transfusion history No date: Vertigo     Comment:  1980s.   Reproductive/Obstetrics negative OB ROS                             Anesthesia Physical Anesthesia Plan  ASA: II  Anesthesia Plan: MAC   Post-op Pain Management:    Induction: Intravenous  PONV Risk Score and Plan: 2  Airway Management Planned: Nasal Cannula  Additional Equipment:   Intra-op Plan:   Post-operative Plan: Extubation in OR  Informed Consent: I have reviewed the patients History and Physical, chart, labs and discussed the procedure including the risks, benefits and alternatives for the proposed anesthesia with the patient or authorized representative who has indicated his/her understanding and acceptance.       Plan Discussed with: CRNA, Anesthesiologist and Surgeon  Anesthesia Plan Comments:         Anesthesia Quick Evaluation

## 2019-12-24 NOTE — Anesthesia Postprocedure Evaluation (Signed)
Anesthesia Post Note  Patient: ANIKO FINNIGAN  Procedure(s) Performed: CATARACT EXTRACTION PHACO AND INTRAOCULAR LENS PLACEMENT (IOC) LEFT 4.07 00:34.4 (Left Eye)  Patient location during evaluation: Short Stay Anesthesia Type: MAC Level of consciousness: awake and alert Pain management: pain level controlled Vital Signs Assessment: post-procedure vital signs reviewed and stable Respiratory status: spontaneous breathing, nonlabored ventilation, respiratory function stable and patient connected to nasal cannula oxygen Cardiovascular status: stable and blood pressure returned to baseline Postop Assessment: no apparent nausea or vomiting Anesthetic complications: no   No complications documented.   Last Vitals:  Vitals:   12/24/19 0940 12/24/19 1120  BP: (!) 144/82 130/79  Pulse: 74 64  Resp: 16 16  Temp: (!) 36.2 C 36.7 C  SpO2: 98% 99%    Last Pain:  Vitals:   12/24/19 1120  TempSrc:   PainSc: 0-No pain                 Rolla Plate P

## 2019-12-24 NOTE — Transfer of Care (Signed)
Immediate Anesthesia Transfer of Care Note  Patient: Leslie Vincent  Procedure(s) Performed: CATARACT EXTRACTION PHACO AND INTRAOCULAR LENS PLACEMENT (IOC) LEFT 4.07 00:34.4 (Left Eye)  Patient Location: Short Stay  Anesthesia Type:MAC  Level of Consciousness: awake, alert  and oriented  Airway & Oxygen Therapy: Patient Spontanous Breathing  Post-op Assessment: Report given to RN and Post -op Vital signs reviewed and stable  Post vital signs: Reviewed  Last Vitals:  Vitals Value Taken Time  BP 130/79 12/24/19 1120  Temp 36.7 C 12/24/19 1120  Pulse 64 12/24/19 1120  Resp 16 12/24/19 1120  SpO2 99 % 12/24/19 1120    Last Pain:  Vitals:   12/24/19 1120  TempSrc:   PainSc: 0-No pain         Complications: No complications documented.

## 2019-12-24 NOTE — H&P (Signed)
[facility]   Primary Care Physician:  Barbette Reichmann, MD Ophthalmologist: Dr. Druscilla Brownie  Pre-Procedure History & Physical: HPI:  Leslie Vincent is a 74 y.o. female here for cataract surgery.   Past Medical History:  Diagnosis Date  . Cataract 07/2016   right  . Hypertension   . Pleurisy   . Pneumonia    history  . Transfusion history   . Vertigo    1980s.    Past Surgical History:  Procedure Laterality Date  . AUGMENTATION MAMMAPLASTY Bilateral 1970's   silicone injections  . BREAST EXCISIONAL BIOPSY Bilateral 2005   Benign  . BREAST SURGERY Bilateral    biopsy. each benign  . CATARACT EXTRACTION W/PHACO Right 08/03/2016   Procedure: CATARACT EXTRACTION PHACO AND INTRAOCULAR LENS PLACEMENT (IOC);  Surgeon: Sallee Lange, MD;  Location: [facility] ORS;  Service: Ophthalmology;  Laterality: Right;  [place] 01:16.9 AP% 21.6 CDE 27.70 Fluid Pack Lot # [id number]  . LACERATION REPAIR  1980   throat    Prior to Admission medications   Medication Sig Start Date End Date Taking? Authorizing Provider  amLODipine (NORVASC) 5 MG tablet Take 7.5 mg by mouth daily in the afternoon.    Yes [provider]  ibuprofen (ADVIL) 200 MG tablet Take 400 mg by mouth every 6 (six) hours as needed for mild pain or moderate pain.   Yes [provider]  losartan-hydrochlorothiazide (HYZAAR) 100-25 MG tablet Take 1 tablet by mouth daily. 11/01/19  Yes [provider]  Magnesium 400 MG TABS Take 400 mg by mouth daily.    Yes [provider]  Multiple Vitamin (MULTIVITAMIN) tablet Take 1 tablet by mouth daily. Centrum  Gummie   Yes [provider]  Specialty Vitamins Products (BIOTIN PLUS KERATIN PO) Take 1 tablet by mouth daily.    Yes [provider]    Allergies as of 10/31/2019 - Review Complete 08/03/2016  Allergen Reaction Noted  . Bee venom Anaphylaxis 07/25/2016    Family History  Problem Relation Age of Onset  . Breast cancer  Neg Hx     Social History   Socioeconomic History  . Marital status: Divorced    Spouse name: Not on file  . Number of children: Not on file  . Years of education: Not on file  . Highest education level: Not on file  Occupational History  . Not on file  Tobacco Use  . Smoking status: Never Smoker  . Smokeless tobacco: Never Used  Vaping Use  . Vaping Use: Never used  Substance and Sexual Activity  . Alcohol use: Yes    Comment: glass of wine couple times a week  . Drug use: Not on file  . Sexual activity: Never  Other Topics Concern  . Not on file  Social History Narrative  . Not on file   Social Determinants of Health   Financial Resource Strain:   . Difficulty of Paying Living Expenses: Not on file  Food Insecurity:   . Worried About Programme researcher, broadcasting/film/video in the Last Year: Not on file  . Ran Out of Food in the Last Year: Not on file  Transportation Needs:   . Lack of Transportation (Medical): Not on file  . Lack of Transportation (Non-Medical): Not on file  Physical Activity:   . Days of Exercise per Week: Not on file  . Minutes of Exercise per Session: Not on file  Stress:   . Feeling of Stress : Not on file  Social  Connections:   . Frequency of Communication with Friends and Family: Not on file  . Frequency of Social Gatherings with Friends and Family: Not on file  . Attends Religious Services: Not on file  . Active Member of Clubs or Organizations: Not on file  . Attends Banker Meetings: Not on file  . Marital Status: Not on file  Intimate Partner Violence:   . Fear of Current or Ex-Partner: Not on file  . Emotionally Abused: Not on file  . Physically Abused: Not on file  . Sexually Abused: Not on file    Review of Systems: See HPI, otherwise negative ROS  Physical Exam: BP (!) 144/82   Pulse 74   Temp (!) 97.2 F (36.2 C) (Temporal)   Resp 16   Ht 4\' 11"  (1.499 m)   Wt 70.3 kg   SpO2 98%   BMI 31.31 kg/m  General:   Alert,   pleasant and cooperative in NAD Head:  Normocephalic and atraumatic. Respiratory:  Normal work of breathing. Heart:  Regular rate and rhythm.   Impression/Plan: is here for cataract surgery.  Risks, benefits, limitations, and alternatives regarding cataract surgery have been reviewed with the patient.  Questions have been answered.  All parties agreeable.   Madolyn Frieze, MD  12/24/2019, 10:43 AM

## 2019-12-24 NOTE — Op Note (Signed)
PREOPERATIVE DIAGNOSIS:  Nuclear sclerotic cataract of the left eye.   POSTOPERATIVE DIAGNOSIS:  Nuclear sclerotic cataract of the left eye.   OPERATIVE PROCEDURE:@   SURGEON:  Galen Manila, MD.   ANESTHESIA:  Anesthesiologist: Emilio Math, DO CRNA: Mathews Argyle, CRNA Student Nurse Anesthetist: Edison Pace, RN  1.      Managed anesthesia care. 2.     0.4ml of Shugarcaine was instilled following the paracentesis   COMPLICATIONS:  None.   TECHNIQUE:   Stop and chop   DESCRIPTION OF PROCEDURE:  The patient was examined and consented in the preoperative holding area where the aforementioned topical anesthesia was applied to the left eye and then brought back to the Operating Room where the left eye was prepped and draped in the usual sterile ophthalmic fashion and a lid speculum was placed. A paracentesis was created with the side port blade and the anterior chamber was filled with viscoelastic. A near clear corneal incision was performed with the steel keratome. A continuous curvilinear capsulorrhexis was performed with a cystotome followed by the capsulorrhexis forceps. Hydrodissection and hydrodelineation were carried out with BSS on a blunt cannula. The lens was removed in a stop and chop  technique and the remaining cortical material was removed with the irrigation-aspiration handpiece. The capsular bag was inflated with viscoelastic and the [id number] lens was placed in the capsular bag without complication. The remaining viscoelastic was removed from the eye with the irrigation-aspiration handpiece. The wounds were hydrated. The anterior chamber was flushed with BSS and the eye was inflated to physiologic pressure. 0.74ml Vigamox was placed in the anterior chamber. The wounds were found to be water tight. The eye was dressed with Combigan. The patient was given protective glasses to wear throughout the day and a shield with which to sleep tonight. The patient was also given drops  with which to begin a drop regimen today and will follow-up with me in one day. Implant Name Type Inv. Item Serial No. Manufacturer Lot No. LRB No. Used Action  Acrysof IQ Aspheric IOL Intraocular Lens  [id number]   Left 1 Implanted    Procedure(s): CATARACT EXTRACTION PHACO AND INTRAOCULAR LENS PLACEMENT (IOC) LEFT 4.07 00:34.4 (Left)  Electronically signed: Galen Manila 12/24/2019 11:18 AM

## 2020-03-18 DIAGNOSIS — Z0000 Encounter for general adult medical examination without abnormal findings: Secondary | ICD-10-CM | POA: Diagnosis not present

## 2020-03-18 DIAGNOSIS — I10 Essential (primary) hypertension: Secondary | ICD-10-CM | POA: Diagnosis not present

## 2020-03-18 DIAGNOSIS — J302 Other seasonal allergic rhinitis: Secondary | ICD-10-CM | POA: Diagnosis not present

## 2020-03-18 DIAGNOSIS — M170 Bilateral primary osteoarthritis of knee: Secondary | ICD-10-CM | POA: Diagnosis not present

## 2020-03-18 DIAGNOSIS — Z91030 Bee allergy status: Secondary | ICD-10-CM | POA: Diagnosis not present

## 2020-03-18 DIAGNOSIS — R945 Abnormal results of liver function studies: Secondary | ICD-10-CM | POA: Diagnosis not present

## 2020-04-14 DIAGNOSIS — T7840XA Allergy, unspecified, initial encounter: Secondary | ICD-10-CM | POA: Diagnosis not present

## 2020-04-14 DIAGNOSIS — I10 Essential (primary) hypertension: Secondary | ICD-10-CM | POA: Diagnosis not present

## 2020-04-14 DIAGNOSIS — G4700 Insomnia, unspecified: Secondary | ICD-10-CM | POA: Diagnosis not present

## 2020-04-14 DIAGNOSIS — Z13820 Encounter for screening for osteoporosis: Secondary | ICD-10-CM | POA: Diagnosis not present

## 2020-04-14 DIAGNOSIS — Z780 Asymptomatic menopausal state: Secondary | ICD-10-CM | POA: Diagnosis not present

## 2020-04-14 DIAGNOSIS — R945 Abnormal results of liver function studies: Secondary | ICD-10-CM | POA: Diagnosis not present

## 2020-04-14 DIAGNOSIS — M170 Bilateral primary osteoarthritis of knee: Secondary | ICD-10-CM | POA: Diagnosis not present

## 2020-04-27 DIAGNOSIS — H26492 Other secondary cataract, left eye: Secondary | ICD-10-CM | POA: Diagnosis not present

## 2020-05-12 DIAGNOSIS — M8588 Other specified disorders of bone density and structure, other site: Secondary | ICD-10-CM | POA: Diagnosis not present

## 2020-10-22 ENCOUNTER — Other Ambulatory Visit: Payer: Self-pay | Admitting: Internal Medicine

## 2020-10-22 DIAGNOSIS — Z1231 Encounter for screening mammogram for malignant neoplasm of breast: Secondary | ICD-10-CM

## 2020-11-20 ENCOUNTER — Ambulatory Visit
Admission: RE | Admit: 2020-11-20 | Discharge: 2020-11-20 | Disposition: A | Discharge status: Home / Self Care | Payer: Medicare Other | Source: Ambulatory Visit | Attending: Internal Medicine | Admitting: Internal Medicine

## 2020-11-20 ENCOUNTER — Other Ambulatory Visit: Payer: Self-pay

## 2020-11-20 DIAGNOSIS — Z1231 Encounter for screening mammogram for malignant neoplasm of breast: Secondary | ICD-10-CM | POA: Diagnosis present

## 2021-01-29 ENCOUNTER — Other Ambulatory Visit: Payer: Self-pay | Admitting: Gastroenterology

## 2021-01-29 DIAGNOSIS — R1319 Other dysphagia: Secondary | ICD-10-CM

## 2021-01-29 DIAGNOSIS — K219 Gastro-esophageal reflux disease without esophagitis: Secondary | ICD-10-CM

## 2021-01-29 DIAGNOSIS — R7989 Other specified abnormal findings of blood chemistry: Secondary | ICD-10-CM

## 2021-01-29 DIAGNOSIS — B182 Chronic viral hepatitis C: Secondary | ICD-10-CM

## 2021-02-12 ENCOUNTER — Ambulatory Visit
Admission: RE | Admit: 2021-02-12 | Discharge: 2021-02-12 | Disposition: A | Discharge status: Home / Self Care | Payer: Medicare Other | Source: Ambulatory Visit | Attending: Gastroenterology | Admitting: Gastroenterology

## 2021-02-12 ENCOUNTER — Other Ambulatory Visit: Payer: Self-pay

## 2021-02-12 DIAGNOSIS — K219 Gastro-esophageal reflux disease without esophagitis: Secondary | ICD-10-CM | POA: Insufficient documentation

## 2021-02-12 DIAGNOSIS — R1319 Other dysphagia: Secondary | ICD-10-CM | POA: Insufficient documentation

## 2021-02-12 DIAGNOSIS — B182 Chronic viral hepatitis C: Secondary | ICD-10-CM | POA: Diagnosis present

## 2021-02-12 DIAGNOSIS — R7989 Other specified abnormal findings of blood chemistry: Secondary | ICD-10-CM

## 2021-12-28 ENCOUNTER — Other Ambulatory Visit: Payer: Self-pay | Admitting: Internal Medicine

## 2021-12-28 DIAGNOSIS — Z1231 Encounter for screening mammogram for malignant neoplasm of breast: Secondary | ICD-10-CM

## 2022-03-14 ENCOUNTER — Ambulatory Visit
Admission: RE | Admit: 2022-03-14 | Discharge: 2022-03-14 | Disposition: A | Discharge status: Home / Self Care | Payer: Medicare PPO | Source: Ambulatory Visit | Attending: Internal Medicine | Admitting: Internal Medicine

## 2022-03-14 DIAGNOSIS — Z1231 Encounter for screening mammogram for malignant neoplasm of breast: Secondary | ICD-10-CM | POA: Diagnosis not present

## 2023-03-30 DIAGNOSIS — R82998 Other abnormal findings in urine: Secondary | ICD-10-CM | POA: Diagnosis not present

## 2023-03-30 DIAGNOSIS — Z6834 Body mass index (BMI) 34.0-34.9, adult: Secondary | ICD-10-CM | POA: Diagnosis not present

## 2023-03-30 DIAGNOSIS — M1711 Unilateral primary osteoarthritis, right knee: Secondary | ICD-10-CM | POA: Diagnosis not present

## 2023-03-30 DIAGNOSIS — I10 Essential (primary) hypertension: Secondary | ICD-10-CM | POA: Diagnosis not present

## 2023-03-30 DIAGNOSIS — J302 Other seasonal allergic rhinitis: Secondary | ICD-10-CM | POA: Diagnosis not present

## 2023-03-30 DIAGNOSIS — R7309 Other abnormal glucose: Secondary | ICD-10-CM | POA: Diagnosis not present

## 2023-04-06 DIAGNOSIS — Z6835 Body mass index (BMI) 35.0-35.9, adult: Secondary | ICD-10-CM | POA: Diagnosis not present

## 2023-04-06 DIAGNOSIS — Z0000 Encounter for general adult medical examination without abnormal findings: Secondary | ICD-10-CM | POA: Diagnosis not present

## 2023-04-06 DIAGNOSIS — N8110 Cystocele, unspecified: Secondary | ICD-10-CM | POA: Diagnosis not present

## 2023-04-06 DIAGNOSIS — Z1231 Encounter for screening mammogram for malignant neoplasm of breast: Secondary | ICD-10-CM | POA: Diagnosis not present

## 2023-04-06 DIAGNOSIS — R7309 Other abnormal glucose: Secondary | ICD-10-CM | POA: Diagnosis not present

## 2023-04-06 DIAGNOSIS — I10 Essential (primary) hypertension: Secondary | ICD-10-CM | POA: Diagnosis not present

## 2023-04-06 DIAGNOSIS — B182 Chronic viral hepatitis C: Secondary | ICD-10-CM | POA: Diagnosis not present

## 2023-05-09 DIAGNOSIS — Z6836 Body mass index (BMI) 36.0-36.9, adult: Secondary | ICD-10-CM | POA: Diagnosis not present

## 2023-05-09 DIAGNOSIS — I10 Essential (primary) hypertension: Secondary | ICD-10-CM | POA: Diagnosis not present

## 2023-05-09 DIAGNOSIS — N814 Uterovaginal prolapse, unspecified: Secondary | ICD-10-CM | POA: Diagnosis not present

## 2023-08-01 ENCOUNTER — Other Ambulatory Visit: Payer: Self-pay

## 2023-08-01 ENCOUNTER — Emergency Department
Admission: EM | Admit: 2023-08-01 | Discharge: 2023-08-01 | Disposition: A | Discharge status: Home / Self Care | Attending: Emergency Medicine | Admitting: Emergency Medicine

## 2023-08-01 DIAGNOSIS — I10 Essential (primary) hypertension: Secondary | ICD-10-CM | POA: Diagnosis not present

## 2023-08-01 DIAGNOSIS — T7840XA Allergy, unspecified, initial encounter: Secondary | ICD-10-CM | POA: Diagnosis not present

## 2023-08-01 DIAGNOSIS — T782XXA Anaphylactic shock, unspecified, initial encounter: Secondary | ICD-10-CM

## 2023-08-01 DIAGNOSIS — T63441A Toxic effect of venom of bees, accidental (unintentional), initial encounter: Secondary | ICD-10-CM | POA: Insufficient documentation

## 2023-08-01 MED ORDER — EPINEPHRINE 0.3 MG/0.3ML IJ SOAJ: 0.3000 mg | INTRAMUSCULAR | 1 refills | Status: AC | PRN

## 2023-08-01 MED ORDER — LACTATED RINGERS IV BOLUS
1000.0000 mL | Freq: Once | INTRAVENOUS | Status: AC
  Administered 2023-08-01: 1000 mL via INTRAVENOUS

## 2023-08-01 NOTE — ED Provider Notes (Signed)
 [facility] Provider Note    Event Date/Time   First MD Initiated Contact with Patient 08/01/23 1024     (approximate)   History   Allergic Reaction   HPI  Leslie Vincent is a 78 y.o. female who presents to the ED for evaluation of Allergic Reaction   Patient presents after a bee sting with anaphylaxis.  Receives 0.5 mg intramuscular epinephrine  with EMS as well as 50 mg of oral Benadryl just prior to arrival and reports improving symptoms arrival to the ED.  He is a history of anaphylaxis with bee stings.  She was walking out of the house this morning to drink some coffee on the front porch when she had a bee sting to the dorsal aspect of her right wrist.   Physical Exam   Triage Vital Signs: ED Triage Vitals  Encounter Vitals Group     BP      Girls Systolic BP Percentile      Girls Diastolic BP Percentile      Boys Systolic BP Percentile      Boys Diastolic BP Percentile      Pulse      Resp      Temp      Temp src      SpO2      Weight      Height      Head Circumference      Peak Flow      Pain Score      Pain Loc      Pain Education      Exclude from Growth Chart     Most recent vital signs: Vitals:   08/01/23 1235 08/01/23 1300  BP: (!) 134/100 126/74  Pulse: 74 66  Resp: 15 17  Temp:    SpO2: 100% 100%    General: Awake, no distress.  Well-appearing.  Insect bite to the dorsal right wrist with mild surrounding erythema.  She is fluent and conversational in full sentences CV:  Good peripheral perfusion.  Resp:  Normal effort.  Abd:  No distention.  MSK:  No deformity noted.  Neuro:  No focal deficits appreciated. Other:     ED Results / Procedures / Treatments   Labs (all labs ordered are listed, but only abnormal results are displayed) Labs Reviewed - No data to display  EKG Sinus rhythm with a rate of 82 bpm.  Normal axis and intervals without clear signs of acute ischemia.  Treatment is  baseline  RADIOLOGY   Official radiology report(s): No results found.  PROCEDURES and INTERVENTIONS:  .1-3 Lead EKG Interpretation  Performed by: Claudene Rover, MD Authorized by: Claudene Rover, MD     Interpretation: normal     ECG rate:  83   ECG rate assessment: normal     Rhythm: sinus rhythm     Ectopy: none     Conduction: normal   .Critical Care  Performed by: Claudene Rover, MD Authorized by: Claudene Rover, MD   Critical care provider statement:    Critical care time (minutes):  30   Critical care time was exclusive of:  Separately billable procedures and treating other patients   Critical care was necessary to treat or prevent imminent or life-threatening deterioration of the following conditions:  Toxidrome   Critical care was time spent personally by me on the following activities:  Development of treatment plan with patient or surrogate, discussions with consultants, evaluation of patient's response to treatment, examination  of patient, ordering and review of laboratory studies, ordering and review of radiographic studies, ordering and performing treatments and interventions, pulse oximetry, re-evaluation of patient's condition and review of old charts   Medications  lactated ringers  bolus 1,000 mL (0 mLs Intravenous Stopped 08/01/23 1303)     IMPRESSION / MDM / ASSESSMENT AND PLAN / ED COURSE  I reviewed the triage vital signs and the nursing notes.  Differential diagnosis includes, but is not limited to, anaphylaxis, anaphylactic shock, atopic reaction, cellulitis  {Patient presents with symptoms of an acute illness or injury that is potentially life-threatening.  Patient presents with signs of anaphylaxis resolving with EMS epi.  We will hold her here and monitor on telemetry.   Clinical Course as of 08/01/23 1303  Tue Aug 01, 2023  1119 Reassessed.  Feeling well.  No complaints [DS]  1302 Patient calling out requesting discharge.  Has had no issues or  indications for repeat dosing of epinephrine .  Suitable for outpatient management.  Will discharge with refills for EpiPen 's to have around the house [DS]    Clinical Course User Index [DS] Claudene Rover, MD     FINAL CLINICAL IMPRESSION(S) / ED DIAGNOSES   Final diagnoses:  Allergic reaction, initial encounter  Anaphylaxis, initial encounter  Bee sting reaction, accidental or unintentional, initial encounter     Rx / DC Orders   ED Discharge Orders          Ordered    EPINEPHrine  0.3 mg/0.3 mL IJ SOAJ injection  As needed        08/01/23 1302             Note:  This document was prepared using Dragon voice recognition software and may include unintentional dictation errors.   Claudene Rover, MD 08/01/23 [phone]

## 2023-08-01 NOTE — Discharge Instructions (Signed)
 Use Benadryl as needed over the next few days.  25-50 mg (1-2 tablets) per dose

## 2023-08-01 NOTE — ED Triage Notes (Signed)
 Patient from home after being stung by a bee to her right wrist. Patient states she walked outside and was stung. EMS gave 50mg  Benydral IM and 0.5mg  IM of EPI. Patient is AOX4 and not complaining of pain besides the sting area.

## 2023-10-04 DIAGNOSIS — Z1231 Encounter for screening mammogram for malignant neoplasm of breast: Secondary | ICD-10-CM | POA: Diagnosis not present

## 2023-10-04 DIAGNOSIS — N8110 Cystocele, unspecified: Secondary | ICD-10-CM | POA: Diagnosis not present

## 2023-10-04 DIAGNOSIS — Z0000 Encounter for general adult medical examination without abnormal findings: Secondary | ICD-10-CM | POA: Diagnosis not present

## 2023-10-04 DIAGNOSIS — I10 Essential (primary) hypertension: Secondary | ICD-10-CM | POA: Diagnosis not present

## 2023-10-04 DIAGNOSIS — R7309 Other abnormal glucose: Secondary | ICD-10-CM | POA: Diagnosis not present

## 2023-10-04 DIAGNOSIS — Z6835 Body mass index (BMI) 35.0-35.9, adult: Secondary | ICD-10-CM | POA: Diagnosis not present

## 2023-10-11 DIAGNOSIS — Z91030 Bee allergy status: Secondary | ICD-10-CM | POA: Diagnosis not present

## 2023-10-11 DIAGNOSIS — I10 Essential (primary) hypertension: Secondary | ICD-10-CM | POA: Diagnosis not present

## 2023-10-11 DIAGNOSIS — R7309 Other abnormal glucose: Secondary | ICD-10-CM | POA: Diagnosis not present

## 2023-10-11 DIAGNOSIS — Z8619 Personal history of other infectious and parasitic diseases: Secondary | ICD-10-CM | POA: Diagnosis not present

## 2023-10-11 DIAGNOSIS — Z6836 Body mass index (BMI) 36.0-36.9, adult: Secondary | ICD-10-CM | POA: Diagnosis not present

## 2023-10-11 DIAGNOSIS — Z9109 Other allergy status, other than to drugs and biological substances: Secondary | ICD-10-CM | POA: Diagnosis not present

## 2023-10-11 DIAGNOSIS — Z1231 Encounter for screening mammogram for malignant neoplasm of breast: Secondary | ICD-10-CM | POA: Diagnosis not present

## 2023-10-30 IMAGING — RF DG ESOPHAGUS
8 series · 14 of 24 positions shown · non-contrast
Comparison: None.

CLINICAL DATA: Esophageal dysphagia

EXAM:
ESOPHOGRAM/BARIUM SWALLOW
TECHNIQUE: Combined double contrast and single contrast examination performed
using effervescent crystals, thick barium liquid, and thin barium
liquid.
FLUOROSCOPY TIME:  Fluoroscopy Time:  2.6 minute
Radiation Exposure Index (if provided by the fluoroscopic device):
29.5 mGy
Number of Acquired Spot Images: 0

[Series 1: cp_standard · 0.27mm/px · 2 of 220 frames shown (1 of 8)]
[frame 34/220]
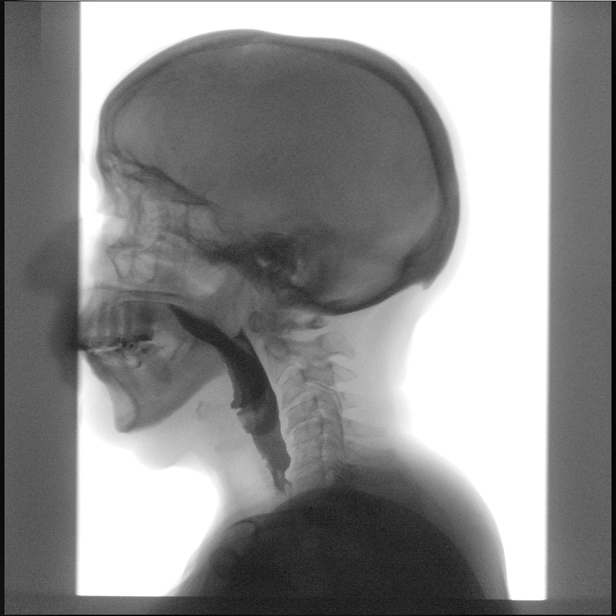
[frame 188/220]
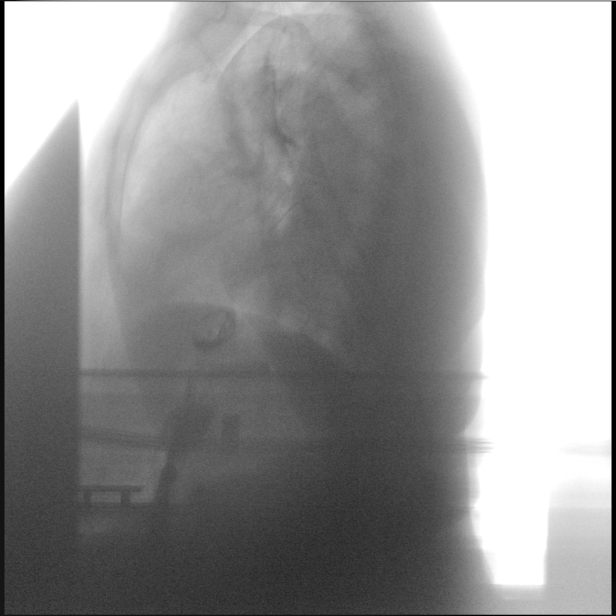

[Series 2: cp_standard · 0.27mm/px · 1 of 228 frames shown (2 of 8)]
[frame 43/228]
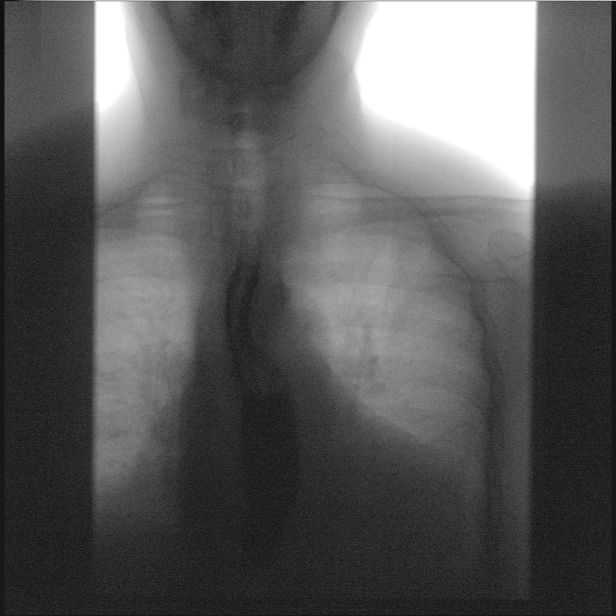

[Series 3: cp_standard · 0.27mm/px · 2 of 343 frames shown (3 of 8)]
[frame 52/343]
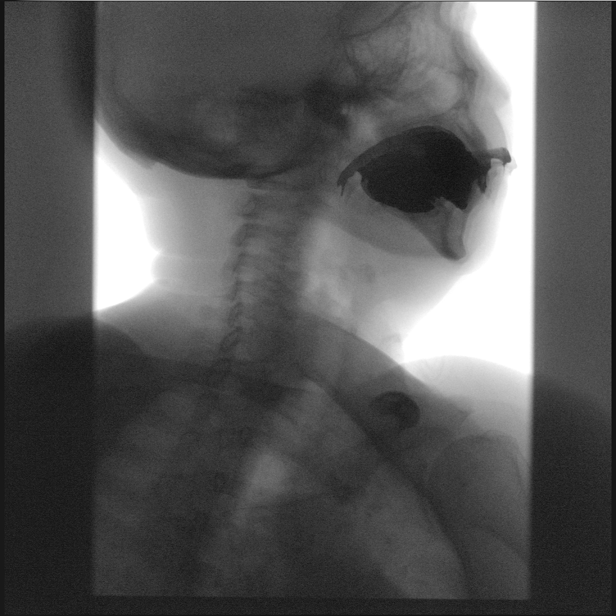
[frame 78/343]
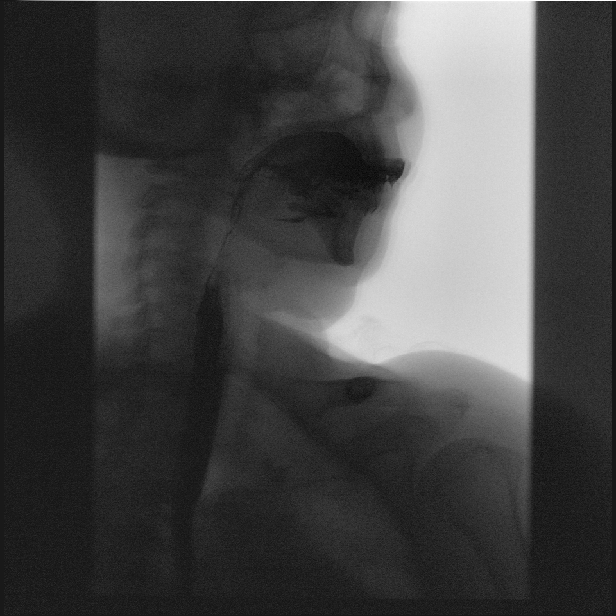

[Series 4: cp_standard · 0.27mm/px · 2 of 152 frames shown (4 of 8)]
[frame 23/152]
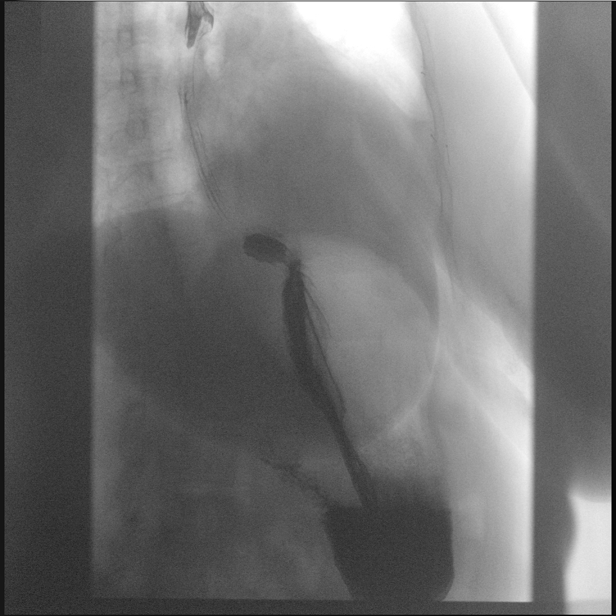
[frame 130/152]
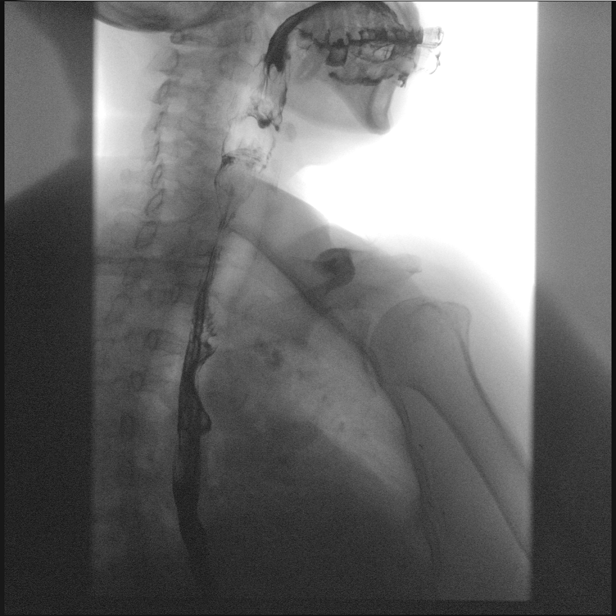

[Series 5: cp_standard · 0.27mm/px · 2 of 222 frames shown (5 of 8)]
[frame 1/222]
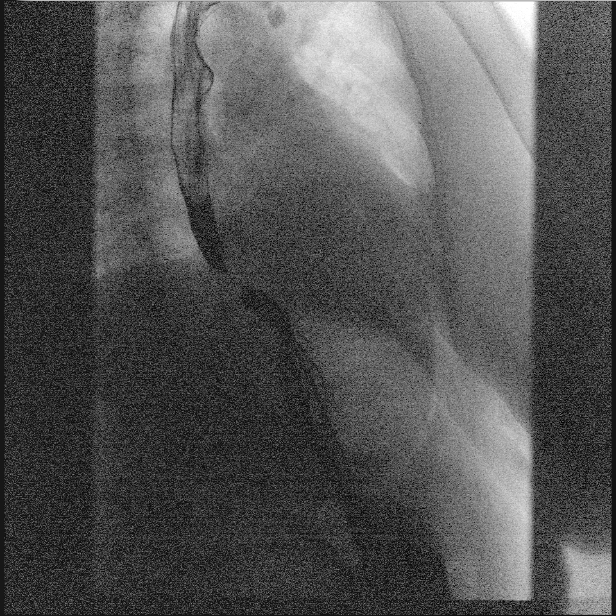
[frame 189/222]
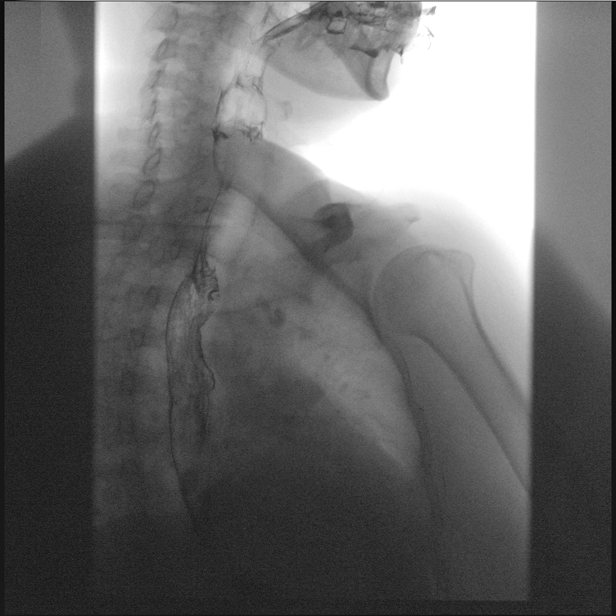

[Series 6: cp_standard · 0.28mm/px · 1 of 257 frames shown (6 of 8)]
[frame 212/257]
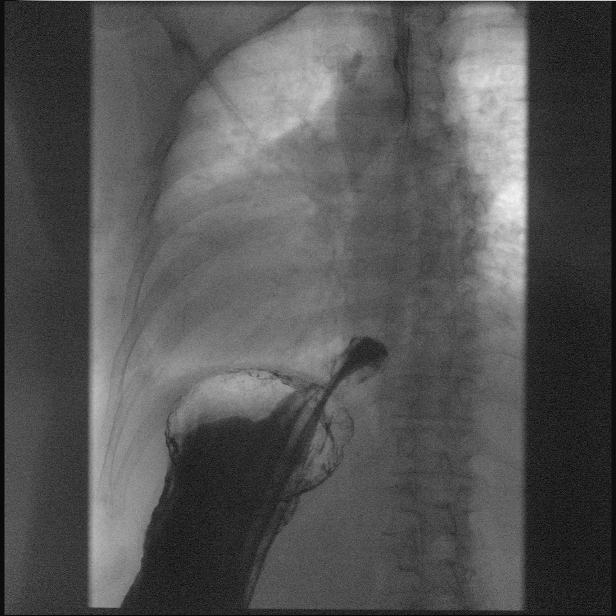

[Series 8: cp_standard · 0.27mm/px · 2 of 73 frames shown (7 of 8)]
[frame 1/73]
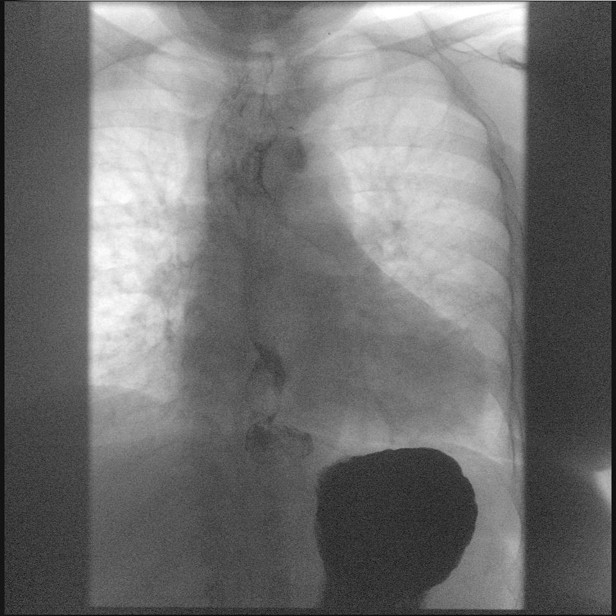
[frame 37/73]
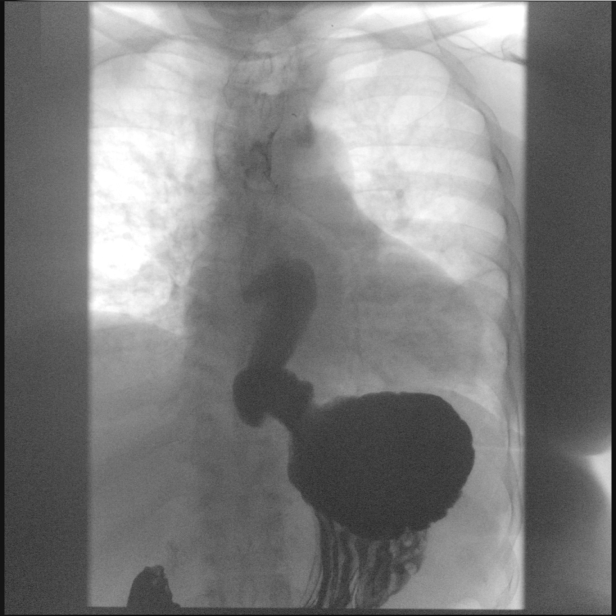

[Series 9: cp_standard · 0.28mm/px · 2 of 58 frames shown (8 of 8)]
[frame 9/58]
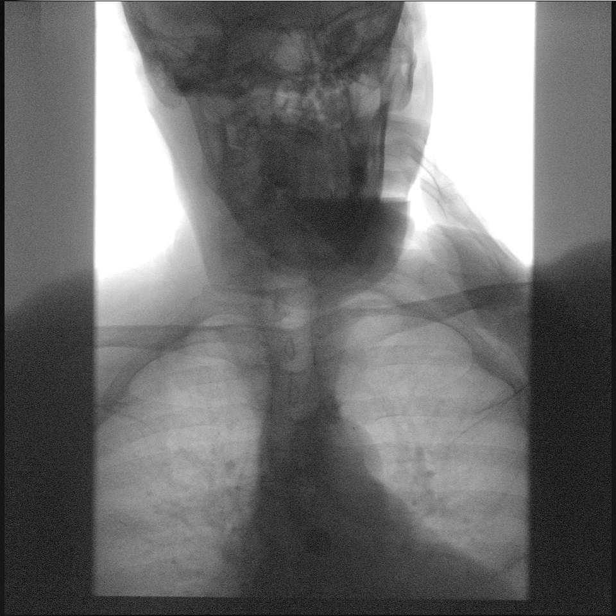
[frame 50/58]
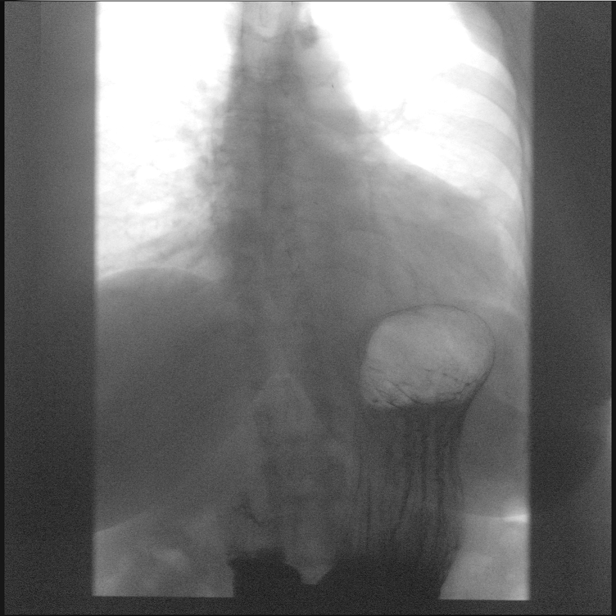

[14 of 24 positions shown; findings below may reference images not displayed]

FINDINGS: Normal pharyngeal anatomy and motility. Contrast flowed freely
through the esophagus without evidence of a stricture or mass.
Normal esophageal mucosa without evidence of irregularity or
ulceration. Esophageal motility was normal. Severe gastroesophageal
reflux into the upper thoracic esophagus. Small hiatal hernia.
IMPRESSION: Severe gastroesophageal reflux into the upper thoracic esophagus.
Small hiatal hernia.

No esophageal stricture.

## 2023-12-15 NOTE — Progress Notes (Addendum)
 Lakyn K Labell                                          MRN: [id number]   12/15/2023   The [facility] Quality Team Specialist reviewed this patient medical record for the purposes of chart review for care gap closure. The following were reviewed: chart review for care gap closure-controlling blood pressure.  01/30/2023- no new cbp to close    [facility] Quality Team
